# Patient Record
Sex: Male | Born: 1955 | Race: White | Hispanic: No | Marital: Single | Smoking: Never smoker
Health system: Southern US, Community
[De-identification: ages and names within clinical notes are randomized; demographics above are authoritative.]

## PROBLEM LIST (undated history)

## (undated) DIAGNOSIS — G4733 Obstructive sleep apnea (adult) (pediatric): Secondary | ICD-10-CM

## (undated) DIAGNOSIS — M7989 Other specified soft tissue disorders: Secondary | ICD-10-CM

## (undated) DIAGNOSIS — Q909 Down syndrome, unspecified: Secondary | ICD-10-CM

## (undated) DIAGNOSIS — R0989 Other specified symptoms and signs involving the circulatory and respiratory systems: Secondary | ICD-10-CM

## (undated) DIAGNOSIS — Z8639 Personal history of other endocrine, nutritional and metabolic disease: Secondary | ICD-10-CM

## (undated) DIAGNOSIS — F79 Unspecified intellectual disabilities: Secondary | ICD-10-CM

## (undated) DIAGNOSIS — I872 Venous insufficiency (chronic) (peripheral): Secondary | ICD-10-CM

## (undated) HISTORY — DX: Personal history of other endocrine, nutritional and metabolic disease: Z86.39

## (undated) HISTORY — DX: Other specified symptoms and signs involving the circulatory and respiratory systems: R09.89

## (undated) HISTORY — DX: Obstructive sleep apnea (adult) (pediatric): G47.33

## (undated) HISTORY — DX: Down syndrome, unspecified: Q90.9

## (undated) HISTORY — DX: Other specified soft tissue disorders: M79.89

## (undated) HISTORY — PX: OTHER SURGICAL HISTORY: SHX169

## (undated) HISTORY — DX: Venous insufficiency (chronic) (peripheral): I87.2

## (undated) HISTORY — DX: Unspecified intellectual disabilities: F79

---

## 2008-08-28 ENCOUNTER — Inpatient Hospital Stay (HOSPITAL_COMMUNITY): Admission: EM | Admit: 2008-08-28 | Discharge: 2008-09-03 | Payer: Self-pay | Admitting: Emergency Medicine

## 2010-03-30 ENCOUNTER — Observation Stay (HOSPITAL_COMMUNITY)
Admission: RE | Admit: 2010-03-30 | Discharge: 2010-04-02 | Payer: Self-pay | Source: Home / Self Care | Attending: Orthopedic Surgery | Admitting: Orthopedic Surgery

## 2010-04-28 NOTE — Op Note (Signed)
NAME:  Steve Byrd, Steve Byrd NO.:  [id number]  MEDICAL RECORD NO.:  [id number]          PATIENT TYPE:  INP  LOCATION:  5038                         FACILITY:  [facility]  PHYSICIAN:  Vania Rea. Autie Vasudevan, M.D.  DATE OF BIRTH:  Jun 21, 1955  DATE OF PROCEDURE:  03/30/2010 DATE OF DISCHARGE:                              OPERATIVE REPORT   PREOPERATIVE DIAGNOSIS:  Displaced right patellar fracture.  POSTOPERATIVE DIAGNOSIS:  Displaced right patellar fracture.  PROCEDURE:  Open reduction and internal fixation of displaced right patella fracture.  SURGEON:  Vania Rea. Markell Sciascia, MD  ASSISTANT:  Lucita Lora. Shuford PA-C  ANESTHESIA:  LMA general as well as a femoral nerve block.  TOURNIQUET TIME:  Less than 1 hour.  ESTIMATED BLOOD LOSS:  Minimal.  DRAINS:  None.  HISTORY:  Steve Byrd is a 55 year old gentleman with Down syndrome who fell sustaining a moderately displaced right transverse patellar fracture. He is brought to the operating room at this time for planned ORIF.  Preoperatively, we counseled Steve Byrd as well as his sister who is his healthcare power of attorney regarding treatment options as well as risks versus benefits thereof.  Possible surgical complications including the potential for bleeding, infection, neurovascular injury, malunion, nonunion, loss of fixation, potential need for additional surgery including hardware removal.  They understand and accept and agreed with our planned procedure.  PROCEDURE IN DETAIL:  After undergoing routine preop evaluation, the patient received prophylactic antibiotics.  Femoral nerve block was established by the Anesthesia Department.  Brought to the operating room, placed in supine on the operating table, underwent smooth induction of an LMA general anesthesia.  Tourniquet applied to the right thigh, right leg was sterilely prepped and draped in standard fashion. Time-out was called.  Leg was exsanguinated with the tourniquet  inflated to 300 mmHg.  An anterior midline incision was then made approximately 12 cm of length over the patella.  Skin flaps were elevated.  Dissection carried deeply and the soft tissues overlying the prepatellar region were then divided and retracted medially and laterally.  This allowed exposure of the fracture site, which was opened with combination of blunt and sharp dissection.  Interposed soft tissue at the fracture site was then irrigated free and removed.  We did visualize the trochlear groove, which was intact.  After the fracture site had been mobilized and meticulously cleaned under direct visualization, we then reduced the patella fracture and temporarily held this with a bone holding tenaculum.  Fluoroscopic imaging was then utilized to confirm good alignment of the fracture site and some adjustments of the reduction were then completed and confirmed overall good alignment.  There was some degree of comminution at the fracture site.  Once this fixation had been temporarily achieved, we then passed two 0.08-998 K-wires from proximal to distal traversing the fracture site with proper positioning confirmed fluoroscopically.  We then passed an 18-gauge stainless steel wire beneath the K-wires both proximally and distally in a figure-of- eight construct.  The tension band was then achieved utilizing a wire twisters allowing excellent compression across the fracture site.  Final fluoroscopic images were then obtained to confirm  good alignment at the fracture site.  We clipped the excess length of the stainless steel wire and also bent the tips of the K-wires and terminally impacted them for a stable construct.  Final images were obtained.  The wound was irrigated. Closed with #1 Vicryl for the deep fascial layer, 2-0 Vicryl for the subcu and intracuticular 3-0 Monocryl for the skin followed by Steri- Strips.  Dry dressing was wrapped about the knee and leg was wrapped with Ace  bandage and support stocking and knee immobilizer, maintaining full extension.  The tourniquet was then let down.  The patient was awakened, extubated, and taken to the recovery room in stable condition.     Vania Rea. Markeis Allman, M.D.     KMS/MEDQ  D:  03/30/2010  T:  03/31/2010  Job:  [id number]  Electronically Signed by Francena Hanly M.D. on 04/26/2010 07:43:50 AM

## 2010-06-12 LAB — PROTIME-INR
INR: 0.98 (ref 0.00–1.49)
Prothrombin Time: 13.2 seconds (ref 11.6–15.2)

## 2010-06-12 LAB — COMPREHENSIVE METABOLIC PANEL
ALT: 14 U/L (ref 0–53)
Albumin: 3.5 g/dL (ref 3.5–5.2)
Alkaline Phosphatase: 71 U/L (ref 39–117)
Calcium: 9.2 mg/dL (ref 8.4–10.5)
GFR calc Af Amer: >60 mL/min (ref >60)
GFR calc non Af Amer: >60 mL/min (ref >60)
Potassium: 4.5 mEq/L (ref 3.5–5.1)
Sodium: 141 mEq/L (ref 135–145)

## 2010-06-12 LAB — GLUCOSE, CAPILLARY
Glucose-Capillary: 131 mg/dL — ABNORMAL HIGH (ref 70–99)
Glucose-Capillary: 134 mg/dL — ABNORMAL HIGH (ref 70–99)
Glucose-Capillary: 141 mg/dL — ABNORMAL HIGH (ref 70–99)
Glucose-Capillary: 146 mg/dL — ABNORMAL HIGH (ref 70–99)
Glucose-Capillary: 151 mg/dL — ABNORMAL HIGH (ref 70–99)
Glucose-Capillary: 154 mg/dL — ABNORMAL HIGH (ref 70–99)

## 2010-06-12 LAB — CBC
MCH: 31.1 pg (ref 26.0–34.0)
MCHC: 32.8 g/dL (ref 30.0–36.0)
RDW: 14.8 % (ref 11.5–15.5)
WBC: 7.9 10*3/uL (ref 4.0–10.5)

## 2010-06-12 LAB — SURGICAL PCR SCREEN: MRSA, PCR: NEGATIVE

## 2010-06-12 LAB — URINALYSIS, ROUTINE W REFLEX MICROSCOPIC
Glucose, UA: NEGATIVE mg/dL
Nitrite: NEGATIVE
Protein, ur: NEGATIVE mg/dL

## 2010-07-03 NOTE — Discharge Summary (Signed)
  NAME:  Steve Byrd, Steve Byrd NO.:  [id number]  MEDICAL RECORD NO.:  [id number]          PATIENT TYPE:  OBV  LOCATION:  5038                         FACILITY:  [facility]  PHYSICIAN:  Vania Rea. Kirti Carl, M.D.  DATE OF BIRTH:  1955-12-22  DATE OF ADMISSION:  03/30/2010 DATE OF DISCHARGE:  04/04/2010                              DISCHARGE SUMMARY   ADMISSION DIAGNOSIS:  Displaced right patella fracture.  DISCHARGE DIAGNOSIS:  Displaced right patella fracture.  OPERATION:  Open reduction and internal fixation.  SURGEON:  Vania Rea. Steve Neels, MD  ASSISTANT:  Steve Lora. Shuford, PA-C  ANESTHESIA:  General anesthetic.  BRIEF HISTORY:  Steve Byrd is a 55 year old, Down syndrome gentleman who sustained a right patella fracture and required an operative intervention.  He scheduled for above-named procedure.  HOSPITAL COURSE:  The patient was admitted and underwent the above-named procedure and tolerated this well.  All appropriate IV antibiotics and analgesics were utilized.  Postoperatively, unfortunately, he had some nausea and vomiting on postop day #1 and this hindered his formal physical therapy.  He was kept an additional day or two, to work through stairs for safety precautions at home.  By the date of April 04, 2010, he was afebrile.  Vital signs were stable.  He was neurovascularly intact.  He had no longer nausea and vomiting.  At this time, he was stable for discharge to home and follow up on an outpatient basis.  LABORATORY VALUES:  On the chart, available for review.  CONDITION ON DISCHARGE:  Stable, improved.  DISCHARGE MEDICATIONS AND PLAN:  The patient is being discharged to home.  He may be weightbearing as tolerated in the knee immobilizer.  No range of motion to the knee.  May shower on day #5.  Follow up in our office in 2 weeks, call for time.  Prescriptions for Percocet was provided as well as Robaxin.  Call for any further problems.  CONDITION ON  DISCHARGE:  Stable, improved.  May resume home diet. Resume other home meds per med reconciliation.     Steve Byrd, P.A.-C.   ______________________________ Vania Rea. Ryka Beighley, M.D.    TAS/MEDQ  D:  05/17/2010  T:  05/17/2010  Job:  [id number]  Electronically Signed by Steve Byrd P.A.-C. on 06/19/2010 08:41:02 AM Electronically Signed by Steve Byrd M.D. on 07/03/2010 12:50:25 PM

## 2010-07-10 ENCOUNTER — Encounter (HOSPITAL_COMMUNITY)
Admission: RE | Admit: 2010-07-10 | Discharge: 2010-07-10 | Disposition: A | Discharge status: Home / Self Care | Payer: Medicare Other | Source: Ambulatory Visit | Attending: Orthopedic Surgery | Admitting: Orthopedic Surgery

## 2010-07-10 LAB — URINALYSIS, ROUTINE W REFLEX MICROSCOPIC
Glucose, UA: NEGATIVE mg/dL
Hgb urine dipstick: NEGATIVE
Ketones, ur: NEGATIVE mg/dL
Leukocytes, UA: NEGATIVE
Protein, ur: NEGATIVE mg/dL
Urobilinogen, UA: 0.2 mg/dL (ref 0.0–1.0)

## 2010-07-10 LAB — GLUCOSE, CAPILLARY
Glucose-Capillary: 126 mg/dL — ABNORMAL HIGH (ref 70–99)
Glucose-Capillary: 127 mg/dL — ABNORMAL HIGH (ref 70–99)
Glucose-Capillary: 136 mg/dL — ABNORMAL HIGH (ref 70–99)
Glucose-Capillary: 143 mg/dL — ABNORMAL HIGH (ref 70–99)
Glucose-Capillary: 155 mg/dL — ABNORMAL HIGH (ref 70–99)
Glucose-Capillary: 155 mg/dL — ABNORMAL HIGH (ref 70–99)
Glucose-Capillary: 162 mg/dL — ABNORMAL HIGH (ref 70–99)
Glucose-Capillary: 174 mg/dL — ABNORMAL HIGH (ref 70–99)
Glucose-Capillary: 186 mg/dL — ABNORMAL HIGH (ref 70–99)

## 2010-07-10 LAB — CBC
HCT: 28.7 % — ABNORMAL LOW (ref 39.0–52.0)
Hemoglobin: 13.1 g/dL (ref 13.0–17.0)
Hemoglobin: 9.5 g/dL — ABNORMAL LOW (ref 13.0–17.0)
MCHC: 33 g/dL (ref 30.0–36.0)
Platelets: 240 10*3/uL (ref 150–400)
RBC: 2.98 MIL/uL — ABNORMAL LOW (ref 4.22–5.81)
RDW: 15.1 % (ref 11.5–15.5)
WBC: 7.4 10*3/uL (ref 4.0–10.5)

## 2010-07-10 LAB — DIFFERENTIAL
Basophils Relative: 1 % (ref 0–1)
Eosinophils Absolute: 0.1 10*3/uL (ref 0.0–0.7)
Eosinophils Relative: 1 % (ref 0–5)
Neutro Abs: 3.7 10*3/uL (ref 1.7–7.7)
Neutrophils Relative %: 49 % (ref 43–77)

## 2010-07-10 LAB — SURGICAL PCR SCREEN
MRSA, PCR: NEGATIVE
Staphylococcus aureus: NEGATIVE

## 2010-07-10 LAB — COMPREHENSIVE METABOLIC PANEL
ALT: 11 U/L (ref 0–53)
Alkaline Phosphatase: 72 U/L (ref 39–117)
BUN: 18 mg/dL (ref 6–23)
Chloride: 104 mEq/L (ref 96–112)
Glucose, Bld: 124 mg/dL — ABNORMAL HIGH (ref 70–99)
Potassium: 4.7 mEq/L (ref 3.5–5.1)
Sodium: 140 mEq/L (ref 135–145)
Total Bilirubin: 0.4 mg/dL (ref 0.3–1.2)

## 2010-07-10 LAB — URINE MICROSCOPIC-ADD ON

## 2010-07-11 LAB — BASIC METABOLIC PANEL
BUN: 9 mg/dL (ref 6–23)
CO2: 29 mEq/L (ref 19–32)
CO2: 30 mEq/L (ref 19–32)
Calcium: 8.1 mg/dL — ABNORMAL LOW (ref 8.4–10.5)
Chloride: 104 mEq/L (ref 96–112)
Creatinine, Ser: 0.81 mg/dL (ref 0.4–1.5)
GFR calc Af Amer: >60 mL/min (ref >60)
GFR calc non Af Amer: >60 mL/min (ref >60)
Glucose, Bld: 142 mg/dL — ABNORMAL HIGH (ref 70–99)
Potassium: 4.1 mEq/L (ref 3.5–5.1)
Sodium: 138 mEq/L (ref 135–145)

## 2010-07-11 LAB — CBC
Hemoglobin: 10.4 g/dL — ABNORMAL LOW (ref 13.0–17.0)
MCHC: 33.2 g/dL (ref 30.0–36.0)
MCV: 95.7 fL (ref 78.0–100.0)
Platelets: 146 10*3/uL — ABNORMAL LOW (ref 150–400)
RBC: 2.97 MIL/uL — ABNORMAL LOW (ref 4.22–5.81)
RBC: 3.25 MIL/uL — ABNORMAL LOW (ref 4.22–5.81)
RDW: 15.5 % (ref 11.5–15.5)

## 2010-07-11 LAB — GLUCOSE, CAPILLARY
Glucose-Capillary: 123 mg/dL — ABNORMAL HIGH (ref 70–99)
Glucose-Capillary: 137 mg/dL — ABNORMAL HIGH (ref 70–99)
Glucose-Capillary: 138 mg/dL — ABNORMAL HIGH (ref 70–99)
Glucose-Capillary: 143 mg/dL — ABNORMAL HIGH (ref 70–99)
Glucose-Capillary: 160 mg/dL — ABNORMAL HIGH (ref 70–99)
Glucose-Capillary: 189 mg/dL — ABNORMAL HIGH (ref 70–99)

## 2010-07-11 LAB — URINALYSIS, ROUTINE W REFLEX MICROSCOPIC
Bilirubin Urine: NEGATIVE
Nitrite: NEGATIVE
Specific Gravity, Urine: 1.016 (ref 1.005–1.030)
Urobilinogen, UA: 0.2 mg/dL (ref 0.0–1.0)
pH: 6.5 (ref 5.0–8.0)

## 2010-07-11 LAB — PROTIME-INR
INR: 1 (ref 0.00–1.49)
Prothrombin Time: 13.2 seconds (ref 11.6–15.2)

## 2010-07-11 LAB — APTT: aPTT: 35 seconds (ref 24–37)

## 2010-07-13 ENCOUNTER — Ambulatory Visit (HOSPITAL_COMMUNITY)
Admission: RE | Admit: 2010-07-13 | Discharge: 2010-07-13 | Disposition: A | Discharge status: Home / Self Care | Payer: Medicare Other | Source: Ambulatory Visit | Attending: Orthopedic Surgery | Admitting: Orthopedic Surgery

## 2010-07-13 DIAGNOSIS — Z01818 Encounter for other preprocedural examination: Secondary | ICD-10-CM | POA: Insufficient documentation

## 2010-07-13 DIAGNOSIS — Z472 Encounter for removal of internal fixation device: Secondary | ICD-10-CM | POA: Insufficient documentation

## 2010-07-13 LAB — GLUCOSE, CAPILLARY: Glucose-Capillary: 127 mg/dL — ABNORMAL HIGH (ref 70–99)

## 2010-07-25 NOTE — Op Note (Signed)
NAME:  Steve Byrd, Steve Byrd NO.:  [id number]  MEDICAL RECORD NO.:  [id number]           PATIENT TYPE:  O  LOCATION:  SDSC                         FACILITY:  [facility]  PHYSICIAN:  Vania Rea. Thedore Pickel, M.D.  DATE OF BIRTH:  [birth year]  DATE OF PROCEDURE:  07/13/2010 DATE OF DISCHARGE:  07/13/2010                              OPERATIVE REPORT   PREOPERATIVE DIAGNOSIS:  Retained hardware, the right knee.  POSTOPERATIVE DIAGNOSIS:  Retained hardware, the right knee.  PROCEDURE:  Removal of hardware from the right knee in the form of an 18- gauge stainless steel tension-band wire as well as two 62,000 K-wires.  SURGEON:  Vania Rea. Natally Ribera, MD  ASSISTANT:  Lucita Lora. Shuford, PAC  ANESTHESIA:  General endotracheal as well as local.  ESTIMATED BLOOD LOSS:  Minimal.  DRAINS:  None.  HISTORY:  Mr. Schwandt is a 55 year old gentleman who is status post an ORIF of a displaced right patella fracture that I performed approximately 6 months ago and he has gone on to solid clinical radiographic union with painful retained hardware.  Brought to the operating room at this time for planned hardware removal.  Preoperatively counseled Ms. Dansereau and his family members on treatment options as well as risks versus benefits thereof.  Possible surgical complications were reviewed including potential for bleeding, infection, neurovascular injury, persistent pain, recurrent injury, and possible need for additional surgery.  They understand and accepted and agreed with planned procedure.  PROCEDURE IN DETAIL:  After undergoing routine preop evaluation, the patient received prophylactic antibiotics, brought to the operating room, placed in supine on the operating table, underwent smooth induction of an LMA general anesthesia.  A tourniquet was applied in the right thigh and right leg was then sterilely prepped and draped in standard fashion.  Time-out was called.  Leg was elevated and  tourniquet was inflated to 350 mmHg.  Through his previous anterior incision, we reopened approximately 6 cm and skin flaps were elevated, skin flaps were circumferentially mobilized.  Dissection was carried deeply and the tension-band wire was identified over the anterior aspect of the patella and then dissection carried proximally to the medial, lateral, and superior poles of the patella and the twist portion of a wire were then elevated.  The wire was then clipped such that it could be extracted with traction.  We then identified the K-wires which had been placed longitudinally and they were withdrawn proximally.  The wound was then irrigated.  We then repaired the longitudinal split within the substance of the deep fascia and the extensor tendon with a #1 Vicryl.  A 2-0 Vicryl was used close the subcu layer and intracuticular 3-0 Monocryl for the skin followed by Steri-Strips.  A 0.5% Marcaine was then instilled liberally along the incision.  Dry dressings were applied.  Leg was wrapped in Ace bandage and the patient was then awakened, extubated, and taken to the recovery room in stable condition.     Vania Rea. Amoura Ransier, M.D.     KMS/MEDQ  D:  07/13/2010  T:  07/13/2010  Job:  [id number]  Electronically Signed by Francena Hanly M.D. on 07/25/2010  06:24:42 PM

## 2010-08-15 NOTE — Op Note (Signed)
NAME:  Steve, Byrd NO.:  [id number]   MEDICAL RECORD NO.:  [id number]          PATIENT TYPE:  INP   LOCATION:  0101                         FACILITY:  [facility]   PHYSICIAN:  Ollen Gross, M.D.    DATE OF BIRTH:  1955/10/04   DATE OF PROCEDURE:  08/28/2008  DATE OF DISCHARGE:                               OPERATIVE REPORT   PREOPERATIVE DIAGNOSIS:  Displaced right femoral neck fracture.   POSTOPERATIVE DIAGNOSIS:  Displaced right femoral neck fracture.   PROCEDURE:  Right hip hemiarthroplasty.   SURGEON:  Ollen Gross, M.D.   ASSISTANT:  Alexzandrew L. Perkins, P.A.C.   ANESTHESIA:  General.   ESTIMATED BLOOD LOSS:  200.   DRAINS:  Hemovac times one.   COMPLICATIONS:  None.   CONDITION:  Stable to recovery room.   BRIEF CLINICAL NOTE:  Steve Byrd is a 55 year old male who fell today  playing football, landing on his right side.  He had immediate pain and  deformity right lower extremity and could not bear weight.  He was taken  to the [facility] emergency room and noted to have a right femoral neck  fracture.  Per the family request he was transferred here for our  management.   PROCEDURE IN DETAIL:  After successful administration of general  anesthetic the patient was placed in the right lateral decubitus  position with the left side up and held with the hip positioner.  Right  lower extremity was isolated from his perineum with plastic drapes and  prepped and draped in the usual sterile fashion.  Short posterolateral  incision is made with a 10 blade through subcutaneous tissue to the  level of the fascia lata which was incised in line with the skin  incision.  The sciatic nerve is palpated and protected and the short  external rotators isolated off the femur.  Capsulotomy is performed and  the fracture is identified.  Fracture hematoma is removed and removed  the femoral head.  Diameter is 49 mm.  The femur was retracted  anteriorly, placed a 49 mm  bipolar and had good suction fit with that.  I then cut the femoral neck down to the appropriate level and prepared  the femur with a starter reamer and then thoroughly irrigated the  femoral canal.  Axial reaming is performed to 13.5 mm, proximal reaming  to an 18D and the sleeve machined to a large.  An 18D large trial sleeve  is placed with an 18 x 13 stem and 36 plus 8 neck matching native  anteversion, a 28 plus zero head and a 49 bipolar is placed.  The hip  was reduced with excellent stability with full extension of external  rotation, 70 degrees flexion, 40 degrees adduction, 90 degrees internal  rotation, 90 degrees of flexion and 70 degrees of internal rotation.  By  placing the right leg on top of the left the leg lengths are equal.  The  trials were removed and then the permanent 18D large sleeve is placed  with an and 18 x 13 stem and 36 plus 8 neck matching native anteversion.  A 28 plus zero head is placed as is the 49 bipolar component.  The hip  was reduced with the same stability parameters.  The wound is then  copiously irrigated with saline solution and then the short rotators and  capsule reattached to the femur through drill holes with Ethibond  suture.  Fascia lata was closed over Hemovac drain with interrupted #1  Vicryl, subcu closed with #1 and 2-0 Vicryl and subcuticular running 4-0  Monocryl.  The incision is then cleaned and dried and Steri-Strips and a  bulky sterile dressing applied.  The drain was hooked to suction and he  is placed into an abduction pillow, awakened and transported to recovery  in stable condition.      Ollen Gross, M.D.  Electronically Signed     FA/MEDQ  D:  08/28/2008  T:  08/29/2008  Job:  [id number]

## 2010-08-15 NOTE — Discharge Summary (Signed)
NAME:  Steve Byrd, Steve Byrd NO.:  [id number]   MEDICAL RECORD NO.:  [id number]          PATIENT TYPE:  INP   LOCATION:  1312                         FACILITY:  [facility]   PHYSICIAN:  Ollen Gross, M.D.    DATE OF BIRTH:  Nov 11, 1955   DATE OF ADMISSION:  08/28/2008  DATE OF DISCHARGE:  09/03/2008                               DISCHARGE SUMMARY   ADDENDUM   ADMITTING AND DISCHARGE DIAGNOSES:  Please see discharge summary.   PROCEDURE:  Right hip hemiarthroplasty on Aug 28, 2008.   BRIEF HISTORY AND LABS:  As per previous discharge summary.   HOSPITAL COURSE:  Originally the patient was scheduled for possible  discharge on September 01, 2008 or September 02, 2008, however, all of paperwork and  passor numbers were not completed.  They were completed on the evening  of September 02, 2008.  Bed was found over at [facility].  He was  seen on rounds on September 03, 2008 by Dr. Lequita Halt.  The patient was doing  well and will be discharged out at that time.   DISCHARGE PLAN:  Discharge plan and meds as per previously dictated  discharge summary.   FOLLOW UP:  Addendum:  The patient will need to follow-up at [facility] at the [facility] office at [address] [place].  He will need to follow-up either September 21, 2008 which is a  Tuesday or September 23, 2008 which Thursday.  Please contact the office at  [phone].  He will make an appointment to transfer this patient over for  follow-up care.  Activity as per previously dictated discharge summary.  Currently he is on Lovenox injections as per meds.   DISPOSITION:  [facility].   CONDITION ON DISCHARGE:  Improved.      Alexzandrew L. Perkins, P.A.C.      Ollen Gross, M.D.  Electronically Signed    ALP/MEDQ  D:  09/03/2008  T:  09/03/2008  Job:  [id number]   cc:   [facility]

## 2010-08-15 NOTE — H&P (Signed)
NAME:  Steve Byrd, Steve Byrd NO.:  [id number]   MEDICAL RECORD NO.:  [id number]          PATIENT TYPE:  INP   LOCATION:  1312                         FACILITY:  [facility]   PHYSICIAN:  Ollen Gross, M.D.    DATE OF BIRTH:  Nov 08, 1955   DATE OF ADMISSION:  08/28/2008  DATE OF DISCHARGE:                              HISTORY & PHYSICAL   CHIEF COMPLAINT:  Right leg pain.   HISTORY OF PRESENT ILLNESS:  The patient is a 55 year old male who was  brought in to [facility] emergency room for injuries sustained to his  right hip and leg.  He is a 55 year old Down syndrome patient who lives  in a group home.  He is accompanied by his family.  He was transferred  over from [facility] where he was initially seen and found to  have a subcapital femoral neck fracture.  He was transferred to [facility] to [facility] for further care, seen by Dr. Ollen Gross  and admitted for surgery.   ALLERGIES:  No known drug allergies.   CURRENT MEDICATIONS:  1. Aricept 5 mg q.a.m.  2. Avandamet 4/100 q.a.m. with breakfast.  3. Omeprazole 20 mg daily.  4. Vytorin 10/40 every night.  5. Aspirin 325 daily.  6. He also uses ciclopirox topical, apply to toenail daily.  7. Mupirocin ointment daily.   PAST MEDICAL HISTORY:  1. Diabetes mellitus.  2. Down syndrome.  3. Sleep apnea, which he currently uses CPAP.   PAST SURGICAL HISTORY:  Negative.   FAMILY HISTORY:  With diabetes.   SOCIAL HISTORY:  A 55 year old male Down syndrome patient who lives in a  group home.   REVIEW OF SYSTEMS:  GENERAL:  No fevers, chills or night sweats.  NEUROLOGICAL:  No seizure, syncope or paralysis. RESPIRATORY:  No  shortness of breath, productive cough or hemoptysis. CARDIOVASCULAR:  No  chest pain, angina or orthopnea. GASTROINTESTINAL:  No nausea, vomiting,  diarrhea or constipation. GENITOURINARY:  No dysuria, hematuria or  discharge. MUSCULOSKELETAL:  Right hip.   PHYSICAL  EXAMINATION:  VITAL SIGNS:  Temperature 97.7, pulse 76,  respirations 20, blood pressure 108/69.  GENERAL:  A 55 year old white male slightly overweight.  He is alert,  oriented, cooperative, very pleasant.  Accompanied by his family.  HEENT:  Normocephalic, atraumatic.  Pupils round and reactive.  EOMs  intact.  NECK:  Supple.  CHEST:  Clear.  HEART:  Regular rhythm.  No murmur.  ABDOMEN:  Round, soft, slightly protuberant abdomen.  Bowel sounds  present.  RECTAL, BREAST, GENITALIA:  Not done, not pertinent to present illness.  EXTREMITIES:  Right lower extremity shortened and externally rotated as  compared to the left.  Motor function is intact.   EKG:  Normal sinus rhythm.  Normal EKG.   LABORATORY DATA:  CBC on admission showed a hemoglobin of 12.7,  hematocrit 37.3, white cell count 10.4.  Electrolytes showed sodium 142,  potassium 4.3, chloride 106, CO2 28, BUN 21, creatinine 1, glucose 117.   X-RAYS:  Chest x-ray showed cardiomegaly without evidence of acute  cardiopulmonary disease.  Right  hip films showed right subcapital  proximal femur with varus angulation.   IMPRESSION:  1. Right subcapital femoral neck fracture, angulated.  2. Diabetes mellitus.  3. Down syndrome.  4. Sleep apnea.   PLAN:  The patient admitted to [facility] to undergo a right  hip hemiarthroplasty.  Surgery is to be performed by Dr. Ollen Gross.      Steve Byrd, P.A.C.      Ollen Gross, M.D.  Electronically Signed    ALP/MEDQ  D:  09/01/2008  T:  09/01/2008  Job:  [id number]   cc:   Ollen Gross, M.D.  Fax: [phone]

## 2010-08-15 NOTE — Discharge Summary (Signed)
NAME:  Steve Byrd, Steve Byrd NO.:  [id number]   MEDICAL RECORD NO.:  [id number]          PATIENT TYPE:  INP   LOCATION:  1312                         FACILITY:  [facility]   PHYSICIAN:  Ollen Gross, M.D.    DATE OF BIRTH:  03/23/1956   DATE OF ADMISSION:  08/28/2008  DATE OF DISCHARGE:  09/01/2008                               DISCHARGE SUMMARY   ADMITTING DIAGNOSES:  1. Right subcapital femoral neck fracture.  2. Diabetes mellitus.  3. Down's syndrome.  4. Sleep apnea, currently uses continuous positive airway pressure.   DISCHARGE DIAGNOSES:  1. Displaced right femoral neck fracture, status post right hip      hemiarthroplasty.  2. Mild postoperative acute blood loss anemia that did not require      transfusion.  3. Diabetes mellitus.  4. Down's syndrome.  5. Sleep apnea, currently uses continuous positive airway pressure.   PROCEDURE:  On Aug 28, 2008, right hip hemiarthroplasty.   SURGEON:  Dr. Lequita Halt.   ASSISTANT:  Avel Peace, PA-C.   ANESTHESIA:  General.   CONSULTS:  None.   BRIEF HISTORY:  Steve Byrd, he goes by Steve Byrd is a 55 year old male who fell  today playing dodge ball landing on his right side and had immediate  pain and deformity, unable to able walk.  He was initially taken to  [facility] emergency room where x-rays found where he had  sustained a fracture.  He was transferred to [facility] for  further care.   LABORATORY DATA:  CBC on admission; hemoglobin 12.7, hematocrit 37.3,  white cell count 10.4.  PT/INR 13.2 and 1.0, PTT of 35.  UA was  negative.  BMET on admission within normal limits.  Serial CBCs were  followed.  Hemoglobin 10.4 to 9.4 back up to 9.5 with a hematocrit of  28.7.  serial BMETs were followed.  Electrolytes remained within normal  limits.   X-RAYS:  Chest x-ray report sent over showed cardiomegaly without  evidence of acute cardiopulmonary disease.  Right hip films shows right  subcapital proximal  femur fracture with varus angulation.   HOSPITAL COURSE:  Steve Byrd is a 55 year old male who was transferred over from  [facility].  He was taken in transfer by Dr. Lequita Halt, seen and  admitted for surgery.  He was taken to OR later that evening and  underwent the above-stated procedure without complication.  The patient  tolerated the procedure well.  Later, he was transferred to the recovery  room and then to the medical surgical floor for postoperative care.  He  was started on p.o. and IV analgesics for pain control.  He was actually  doing pretty well on day one with minimal pain.  Hemoglobin was at 10.4.  Electrolytes looked good.  He had a Hemovac drain placed time at the  time surgery which was pulled without difficulty.  He was placed on  Lovenox for a DVT prophylaxis and encouraged p.o. meds.  He started bed  to chair transfers.  He did get up and walk about just a few steps in  the room.  By day #  2, he was doing well.  He was afebrile.  He was  walking about 6 feet in the room with assistance therapy.  We got  discharge planning involved.  We felt that the patient would require  some type of skilled nursing facility prior to going back to a group  home where he needed to be fully independent.  Discharge planning got  involved.  He was seen on day #3.  Dressing change, incision looked  good.  Hemoglobin was at 9.5.  We discontinued the fluids and the IVs  since the patient was eating well, no complaints.  By postop day #4,  September 01, 2008, there was a possibility that a bed was available.  He was  seen in rounds by Dr. Lequita Halt and no complaints, no pain, doing well,  and it was found that the patient would transferred out once final  arrangements were made.   DISCHARGE/PLAN:  1. The patient will be transferred to a skilled nursing facility of      choice, awaiting final bed offers.  2. Discharge diagnoses, please see above.  3. Discharge medications:  Current medications at  time of transfer      include, Lovenox injections 40 mg subcu daily for 6 more days and      then discontinue, Colace 100 mg p.o. b.i.d., Robaxin 500 mg p.o.      q.6-8 h., p.r.n. spasm, Benadryl 12.5-25 p.o. nightly p.r.n. sleep,      Percocet 5 mg 1-2 every 4 hours as needed for pain, Avandamet      07/998 p.o. q.a.m., Prilosec 20 mg p.o. daily, Aricept 5 mg p.o.      q.a.m., Vytorin 10/40 p.o. nightly.  He may resume his aspirin 325      mg p.o. daily.  He also takes Ciclopirox appointment to feet and      toes daily and also mupirocin ointment 2% to sores of his knees      twice a day.   DIET:  Diabetic, medium modified carb diet.   ACTIVITY:  He can be weightbearing as tolerated to the right lower  extremity, hip precautions.  He was utilizing an abduction pillow in the  hospital, but this may be discontinued.  Recommend that he uses a  regular bed pillow between his knees.  While he is in the chair and  while he is in the bed, does not need any brace for walking.  However,  he can use a walker for mobility.  PT and OT for gait training  ambulation, ADLs.  He needs daily dressing change to the right hip  incision.  He may start showering, however, do not submerge the incision  under water.   FOLLOW UP:  He needs to follow-up in approximately 2 weeks with Dr.  Ollen Gross at the [facility] office in [address] [place]  [place]  Please contact the office of [phone] to help  make an appointment and transfer arrangements for this patient.   DISPOSITION:  Pending at this time, we are waiting on final bed offers.   CONDITION ON DISCHARGE:  Improving.      Alexzandrew L. Perkins, P.A.C.      Ollen Gross, M.D.  Electronically Signed    ALP/MEDQ  D:  09/01/2008  T:  09/01/2008  Job:  [id number]   cc:   Ollen Gross, M.D.  Fax: [phone]

## 2010-08-15 NOTE — Discharge Summary (Signed)
NAME:  Steve Byrd, Steve Byrd NO.:  [id number]   MEDICAL RECORD NO.:  [id number]          PATIENT TYPE:  INP   LOCATION:  1312                         FACILITY:  [facility]   PHYSICIAN:  Ollen Gross, M.D.    DATE OF BIRTH:  12-20-1955   DATE OF ADMISSION:  08/28/2008  DATE OF DISCHARGE:                               DISCHARGE SUMMARY   ADDENDUM:   ADMITTING AND DISCHARGE DIAGNOSES:  As per previous discharge summary.   SURGERY:  As per previous discharge summary.   HISTORY:  Martinez who goes by W.R. is a 55 year old male who underwent  right hip hemiarthroplasty.  He originally was scheduled for discharge  on September 01, 2008, but a bed was not available.  He remained in the  hospital an extra day, resting well.  Seen on September 02, 2008, no  complaints, doing well and tentative plan was for discharge today.   DISCHARGE PLAN:  As per previously dictated summary, please see  discharge summary.   CONDITION ON DISCHARGE:  Improving.   DISPOSITION:  Pending at this time, waiting on final bed offer.      Alexzandrew L. Perkins, P.A.C.      Ollen Gross, M.D.  Electronically Signed    ALP/MEDQ  D:  09/02/2008  T:  09/02/2008  Job:  [id number]   cc:   Ollen Gross, M.D.  Fax: [phone]

## 2010-11-09 DIAGNOSIS — M7989 Other specified soft tissue disorders: Secondary | ICD-10-CM

## 2010-11-09 DIAGNOSIS — R0989 Other specified symptoms and signs involving the circulatory and respiratory systems: Secondary | ICD-10-CM

## 2010-11-09 DIAGNOSIS — E119 Type 2 diabetes mellitus without complications: Secondary | ICD-10-CM | POA: Insufficient documentation

## 2011-08-26 IMAGING — CR DG CHEST 2V
2 series · 2 of 2 positions shown · non-contrast
Comparison: None

CLINICAL DATA: Preop respiratory exam.  Patella surgery.

CHEST - 2 VIEW

[view not recorded (1 of 2)]
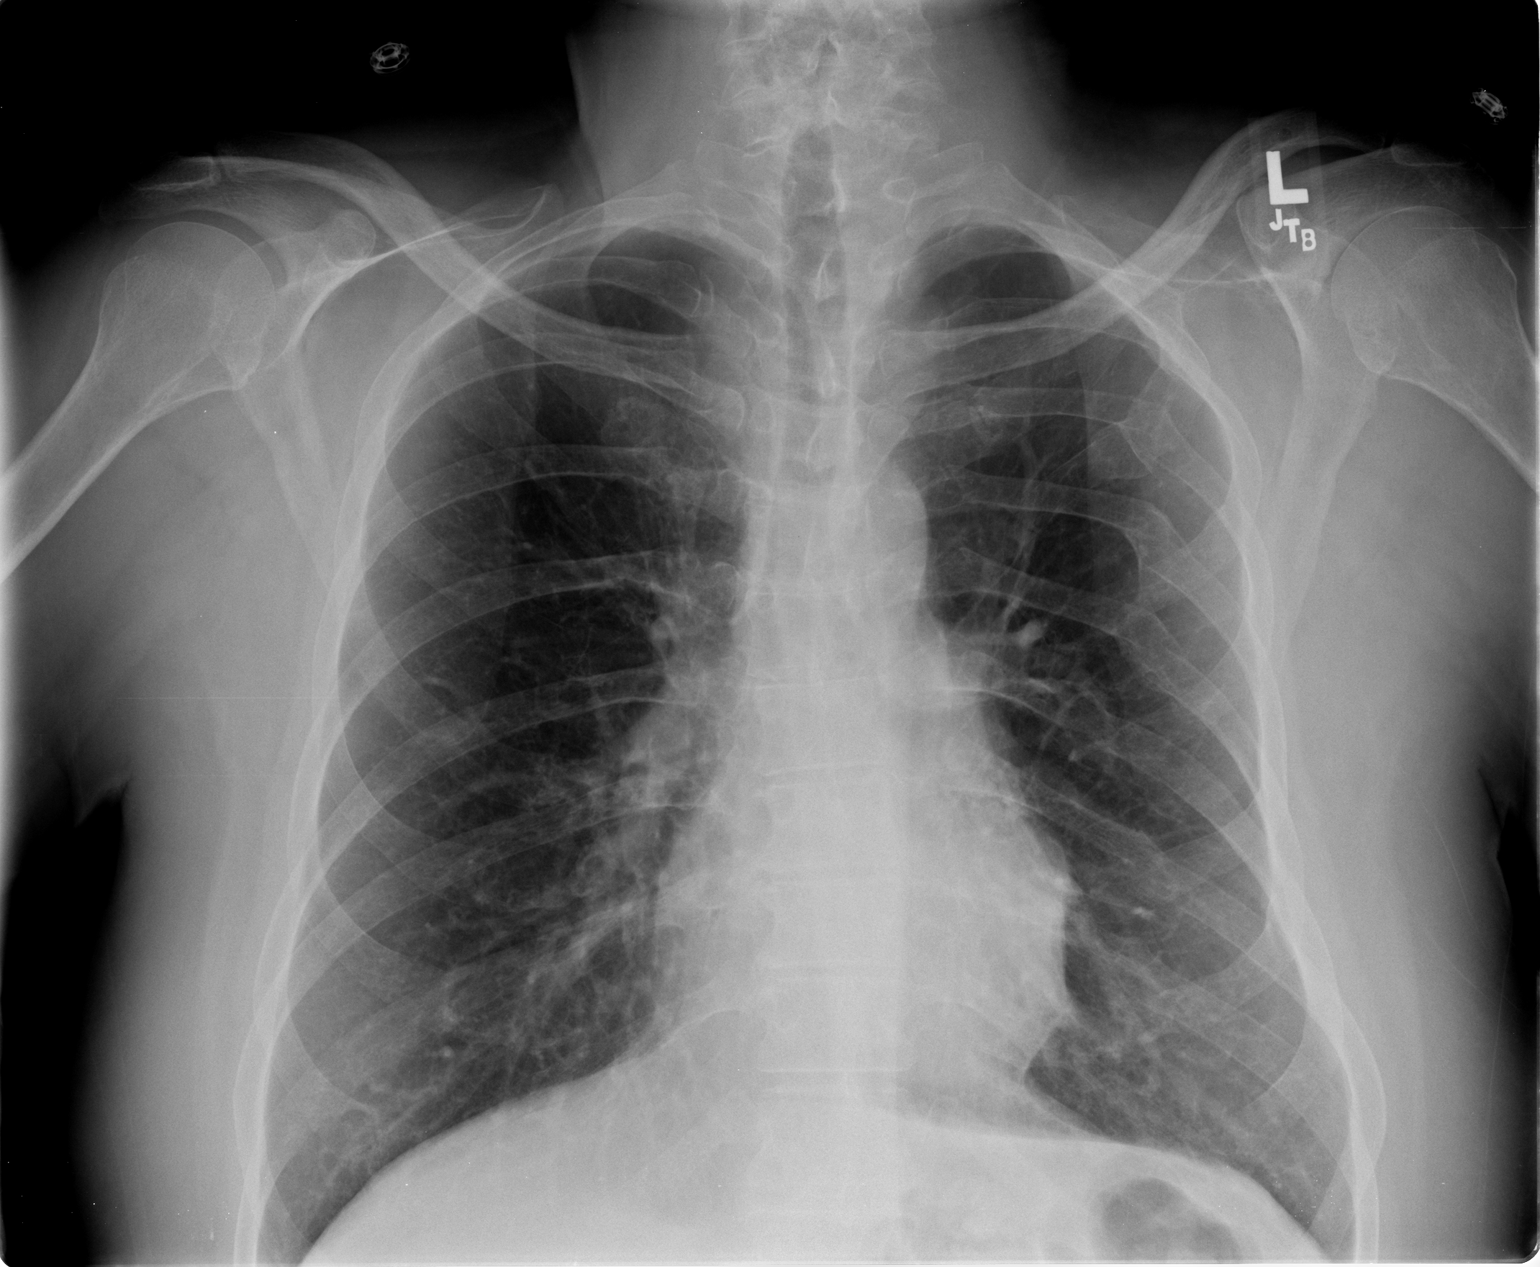

[view not recorded (2 of 2)]
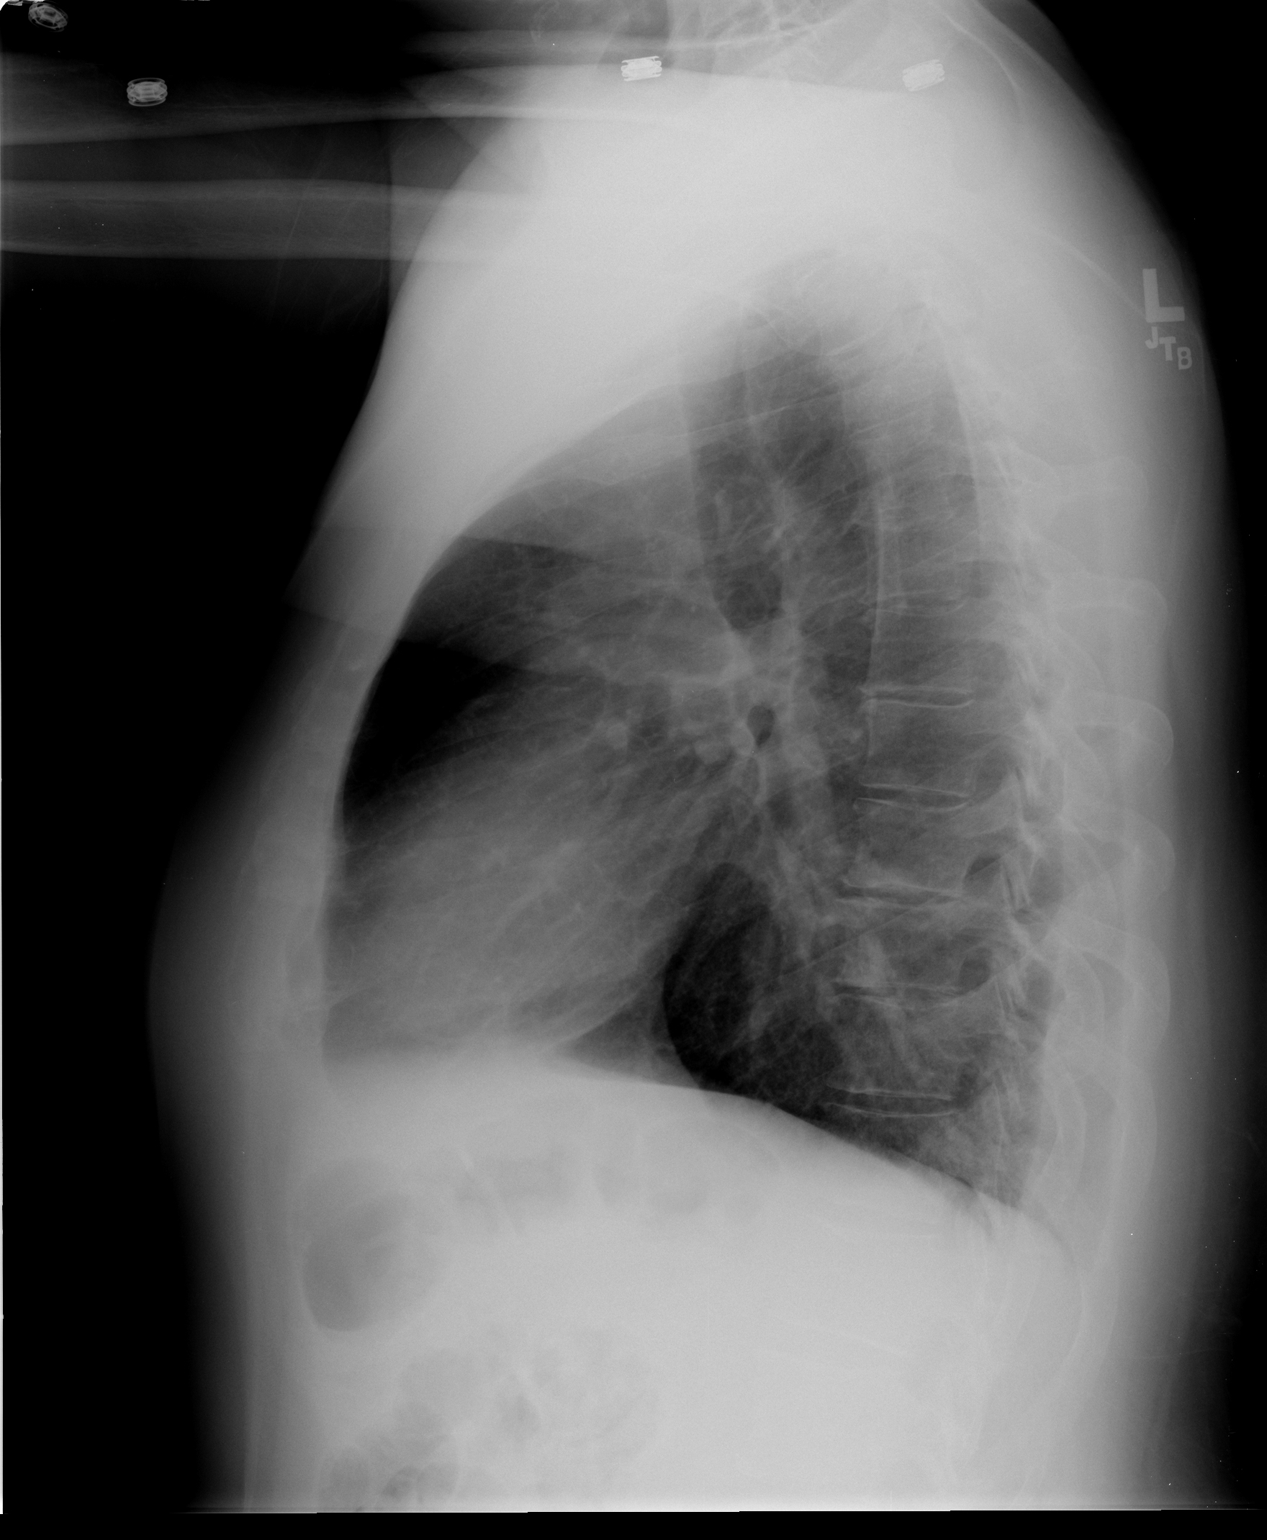

[2 of 2 positions shown; findings below may reference images not displayed]

FINDINGS: Heart size is normal.  Negative for heart failure.  The
lungs are clear without infiltrate or effusion.  Multiple chronic
healed rib fractures on the left.
IMPRESSION: No acute cardiopulmonary disease.

## 2012-04-02 HISTORY — PX: NECK SURGERY: SHX720

## 2013-01-22 ENCOUNTER — Ambulatory Visit (INDEPENDENT_AMBULATORY_CARE_PROVIDER_SITE_OTHER): Payer: Medicare Other

## 2013-01-22 VITALS — BP 133/73 | HR 46

## 2013-01-22 DIAGNOSIS — M2010 Hallux valgus (acquired), unspecified foot: Secondary | ICD-10-CM

## 2013-01-22 DIAGNOSIS — E1149 Type 2 diabetes mellitus with other diabetic neurological complication: Secondary | ICD-10-CM

## 2013-01-22 DIAGNOSIS — L608 Other nail disorders: Secondary | ICD-10-CM

## 2013-01-22 DIAGNOSIS — E1140 Type 2 diabetes mellitus with diabetic neuropathy, unspecified: Secondary | ICD-10-CM

## 2013-01-22 DIAGNOSIS — M2040 Other hammer toe(s) (acquired), unspecified foot: Secondary | ICD-10-CM

## 2013-01-22 NOTE — Patient Instructions (Signed)

## 2013-01-22 NOTE — Progress Notes (Signed)
  Subjective:    Patient ID: Steve Byrd, male    DOB: 1955-11-12, 57 y.o.   MRN: [id number]  HPI trim my nails Patient presents this time for followup diabetic foot and nail care. No changes in medication her health history since last seen.   Review of Systems  Constitutional: Negative.   HENT: Negative.   Respiratory: Negative.   Cardiovascular: Negative.   Endocrine: Negative.   Neurological: Positive for numbness.       Objective:   Physical Exam  Vitals reviewed. Constitutional: He appears well-developed and well-nourished.  Cardiovascular:  Pulses:      Dorsalis pedis pulses are 2+ on the right side, and 2+ on the left side.       Posterior tibial pulses are 0 on the right side, and 0 on the left side.  Capillary refill time 4 seconds all digits skin temperature warm to cool turgor normal no edema. There is signs of venous stasis and varicosities both lower extremities legs and ankles  Musculoskeletal: Normal range of motion.  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 Semirigid digital contractures 2 through 5 bilateral. History of keratoses no open wounds or ulcerations noted current time.  Neurological: He is alert. He has normal strength.  Epicritic sensation diminished on Semmes Weinstein testing to the forefoot digits and plantar forefoot. DTRs not elicited. Normal plantar response bilateral to  Skin: Skin is warm and dry. No cyanosis. Nails show no clubbing.  Skin color and pigment are altered due to hyperpigmentation and venous stasis changes both lower extremity. Skin is thinned with decreased turgor. Nails thick brittle friable discolored and darkened and incurvated 1 through 5 bilateral.          Assessment & Plan:  Diabetes with history peripheral neuropathy affecting both lower extremities. Patient does have some muscle strength loss and walks with the assistance of a walker. Nails thick brittle dystrophic friable a debridement  presence of diabetes and complicate factors x10. Patient does have dry friable skin recommend lotion to skin daily/diabetes foot care instructions dispensed for patient use. Followup in 3 months for continued nail N. diabetic foot care  Alvan Dame DPM

## 2013-03-31 ENCOUNTER — Other Ambulatory Visit: Payer: Self-pay

## 2013-04-23 ENCOUNTER — Ambulatory Visit: Payer: Medicare Other

## 2013-05-21 ENCOUNTER — Ambulatory Visit: Payer: Medicare Other

## 2013-06-11 ENCOUNTER — Ambulatory Visit (INDEPENDENT_AMBULATORY_CARE_PROVIDER_SITE_OTHER): Payer: Medicare Other

## 2013-06-11 VITALS — BP 131/74 | HR 56 | Resp 18

## 2013-06-11 DIAGNOSIS — L608 Other nail disorders: Secondary | ICD-10-CM

## 2013-06-11 DIAGNOSIS — R0989 Other specified symptoms and signs involving the circulatory and respiratory systems: Secondary | ICD-10-CM

## 2013-06-11 DIAGNOSIS — E1149 Type 2 diabetes mellitus with other diabetic neurological complication: Secondary | ICD-10-CM

## 2013-06-11 DIAGNOSIS — E1140 Type 2 diabetes mellitus with diabetic neuropathy, unspecified: Secondary | ICD-10-CM

## 2013-06-11 NOTE — Progress Notes (Signed)
   Subjective:    Patient ID: Michele RockersWilliam Fleet, male    DOB: [birth year], 58 y.o.   MRN: [id number]  HPI I am here to get my toenails trimmed    Review of Systems no new changes or findings at this time     Objective:   Physical Exam Okay objective findings as follows pedal pulses palpable DP +2/4 bilateral PT nonpalpable bilateral temperature warm to cool turgor diminished dry scaling fissure skin noted bilateral recommended lotions. There is some signs of venous stasis in her pigmentation both lower extremities the legs and ankle areas. Neurologically epicritic and proprioceptive sensations reveal absent sensation to the digits plantar forefoot arch DTRs not elicited there is normal plantar response bilateral. Biomechanically there is notable HAV deformity bilateral digital contractures the is 2 through 5 bilateral with keratoses in the past no current open wounds or ulcerations are noted dermatologic again pigment and hair color normal some other pigmentation the ankle there is thick and brittle crumbly friable gratified thick nails with discoloration and friability 1 through 5 bilateral.       Assessment & Plan:  Assessment this time his diabetes with peripheral neuropathy and angiopathy vascular compromise as well this time thick painful mycotic dystrophic brittle nails 1 through 5 bilateral debrided and the presence of diabetes and complications return for future palliative care is needed also suggested lotion to the feet on a daily basis reappointed 3 months for continued palliative care next progress Alvan Dameichard Donaven Criswell DPM

## 2013-06-11 NOTE — Patient Instructions (Signed)
Diabetes and Foot Care Diabetes may cause you to have problems because of poor blood supply (circulation) to your feet and legs. This may cause the skin on your feet to become thinner, break easier, and heal more slowly. Your skin may become dry, and the skin may peel and crack. You may also have nerve damage in your legs and feet causing decreased feeling in them. You may not notice minor injuries to your feet that could lead to infections or more serious problems. Taking care of your feet is one of the most important things you can do for yourself.  HOME CARE INSTRUCTIONS  Wear shoes at all times, even in the house. Do not go barefoot. Bare feet are easily injured.  Check your feet daily for blisters, cuts, and redness. If you cannot see the bottom of your feet, use a mirror or ask someone for help.  Wash your feet with warm water (do not use hot water) and mild soap. Then pat your feet and the areas between your toes until they are completely dry. Do not soak your feet as this can dry your skin.  Apply a moisturizing lotion or petroleum jelly (that does not contain alcohol and is unscented) to the skin on your feet and to dry, brittle toenails. Do not apply lotion between your toes.  Trim your toenails straight across. Do not dig under them or around the cuticle. File the edges of your nails with an emery board or nail file.  Do not cut corns or calluses or try to remove them with medicine.  Wear clean socks or stockings every day. Make sure they are not too tight. Do not wear knee-high stockings since they may decrease blood flow to your legs.  Wear shoes that fit properly and have enough cushioning. To break in new shoes, wear them for just a few hours a day. This prevents you from injuring your feet. Always look in your shoes before you put them on to be sure there are no objects inside.  Do not cross your legs. This may decrease the blood flow to your feet.  If you find a minor scrape,  cut, or break in the skin on your feet, keep it and the skin around it clean and dry. These areas may be cleansed with mild soap and water. Do not cleanse the area with peroxide, alcohol, or iodine.  When you remove an adhesive bandage, be sure not to damage the skin around it.  If you have a wound, look at it several times a day to make sure it is healing.  Do not use heating pads or hot water bottles. They may burn your skin. If you have lost feeling in your feet or legs, you may not know it is happening until it is too late.  Make sure your health care provider performs a complete foot exam at least annually or more often if you have foot problems. Report any cuts, sores, or bruises to your health care provider immediately. SEEK MEDICAL CARE IF:   You have an injury that is not healing.  You have cuts or breaks in the skin.  You have an ingrown nail.  You notice redness on your legs or feet.  You feel burning or tingling in your legs or feet.  You have pain or cramps in your legs and feet.  Your legs or feet are numb.  Your feet always feel cold. SEEK IMMEDIATE MEDICAL CARE IF:   There is increasing redness,   swelling, or pain in or around a wound.  There is a red line that goes up your leg.  Pus is coming from a wound.  You develop a fever or as directed by your health care provider.  You notice a bad smell coming from an ulcer or wound. Document Released: 03/16/2000 Document Revised: 11/19/2012 Document Reviewed: 08/26/2012 [facility] Patient Information 2014 [place], [place].  Scar recommendation to apply lotion to the affected feet twice daily to help with the dry scaled and fissured skin and the hand her skin lotion is acceptable

## 2013-09-10 ENCOUNTER — Ambulatory Visit (INDEPENDENT_AMBULATORY_CARE_PROVIDER_SITE_OTHER): Payer: Medicare Other

## 2013-09-10 VITALS — BP 135/74 | HR 58 | Resp 18

## 2013-09-10 DIAGNOSIS — E1149 Type 2 diabetes mellitus with other diabetic neurological complication: Secondary | ICD-10-CM

## 2013-09-10 DIAGNOSIS — L608 Other nail disorders: Secondary | ICD-10-CM

## 2013-09-10 DIAGNOSIS — E1140 Type 2 diabetes mellitus with diabetic neuropathy, unspecified: Secondary | ICD-10-CM

## 2013-09-10 NOTE — Patient Instructions (Signed)
Diabetes and Foot Care Diabetes may cause you to have problems because of poor blood supply (circulation) to your feet and legs. This may cause the skin on your feet to become thinner, break easier, and heal more slowly. Your skin may become dry, and the skin may peel and crack. You may also have nerve damage in your legs and feet causing decreased feeling in them. You may not notice minor injuries to your feet that could lead to infections or more serious problems. Taking care of your feet is one of the most important things you can do for yourself.  HOME CARE INSTRUCTIONS  Wear shoes at all times, even in the house. Do not go barefoot. Bare feet are easily injured.  Check your feet daily for blisters, cuts, and redness. If you cannot see the bottom of your feet, use a mirror or ask someone for help.  Wash your feet with warm water (do not use hot water) and mild soap. Then pat your feet and the areas between your toes until they are completely dry. Do not soak your feet as this can dry your skin.  Apply a moisturizing lotion or petroleum jelly (that does not contain alcohol and is unscented) to the skin on your feet and to dry, brittle toenails. Do not apply lotion between your toes.  Trim your toenails straight across. Do not dig under them or around the cuticle. File the edges of your nails with an emery board or nail file.  Do not cut corns or calluses or try to remove them with medicine.  Wear clean socks or stockings every day. Make sure they are not too tight. Do not wear knee-high stockings since they may decrease blood flow to your legs.  Wear shoes that fit properly and have enough cushioning. To break in new shoes, wear them for just a few hours a day. This prevents you from injuring your feet. Always look in your shoes before you put them on to be sure there are no objects inside.  Do not cross your legs. This may decrease the blood flow to your feet.  If you find a minor scrape,  cut, or break in the skin on your feet, keep it and the skin around it clean and dry. These areas may be cleansed with mild soap and water. Do not cleanse the area with peroxide, alcohol, or iodine.  When you remove an adhesive bandage, be sure not to damage the skin around it.  If you have a wound, look at it several times a day to make sure it is healing.  Do not use heating pads or hot water bottles. They may burn your skin. If you have lost feeling in your feet or legs, you may not know it is happening until it is too late.  Make sure your health care provider performs a complete foot exam at least annually or more often if you have foot problems. Report any cuts, sores, or bruises to your health care provider immediately. SEEK MEDICAL CARE IF:   You have an injury that is not healing.  You have cuts or breaks in the skin.  You have an ingrown nail.  You notice redness on your legs or feet.  You feel burning or tingling in your legs or feet.  You have pain or cramps in your legs and feet.  Your legs or feet are numb.  Your feet always feel cold. SEEK IMMEDIATE MEDICAL CARE IF:   There is increasing redness,   swelling, or pain in or around a wound.  There is a red line that goes up your leg.  Pus is coming from a wound.  You develop a fever or as directed by your health care provider.  You notice a bad smell coming from an ulcer or wound. Document Released: 03/16/2000 Document Revised: 11/19/2012 Document Reviewed: 08/26/2012 [employer] Patient Information 2014 [employer].  

## 2013-09-10 NOTE — Progress Notes (Signed)
   Subjective:    Patient ID: Steve Byrd, male    DOB: [birth year], 58 y.o.   MRN: [id number]  HPI  I AM HERE TO GET THESE TOENAILS TRIMMED AND IT HAS BEEN 3 MONTHS    Review of Systems no new findings or systemic changes noted     Objective:   Physical Exam Neurovascular status is intact and unchanged DP +2/4 bilateral PT thready nonpalpable some hyperpigmentation of both legs with history is of posse some cellulitis or venous stasis type difficulties. No active infection is no discharge drainage open wounds or ulcerations noted nails thick brittle crumbly friable discolored 1 through 5 bilateral painful and tender both and debridement palpation there is normal plantar response and DTRs neurologically decreased sensation noted that on Semmes Weinstein testing to forefoot digits is slight hammertoe deformities and digital contractures as mentioned previously again neuropathy with decreased sensations motor function is otherwise normal       Assessment & Plan:  Assessment this time his diabetes with history peripheral neuropathy and angiopathy vascular compromise pedal pulses are diminished PT pulse and some venous stasis noted nails debrided 1 through 5 bilateral the presence of diabetes and complications return for future palliative diabetic foot and nail care in 3 months for an as-needed basis for followup  Alvan Dame DPM

## 2013-11-06 ENCOUNTER — Telehealth: Payer: Self-pay | Admitting: *Deleted

## 2013-11-06 NOTE — Telephone Encounter (Signed)
We faxed over prescription saying the Dermasil is not available.  The group home is asking if it can be changed to something else.  Call me back.  Switch to a different lotion for his feet.

## 2013-11-06 NOTE — Telephone Encounter (Signed)
Called the pharmacy and stated that Dr Ralene CorkSikora said that the patient can use any type of cream over the counter. lisa

## 2013-12-10 ENCOUNTER — Ambulatory Visit: Payer: Medicare Other

## 2013-12-24 ENCOUNTER — Ambulatory Visit (INDEPENDENT_AMBULATORY_CARE_PROVIDER_SITE_OTHER): Payer: Medicare Other

## 2013-12-24 VITALS — BP 135/86 | HR 54 | Resp 18

## 2013-12-24 DIAGNOSIS — L608 Other nail disorders: Secondary | ICD-10-CM

## 2013-12-24 DIAGNOSIS — E1149 Type 2 diabetes mellitus with other diabetic neurological complication: Secondary | ICD-10-CM

## 2013-12-24 DIAGNOSIS — R0989 Other specified symptoms and signs involving the circulatory and respiratory systems: Secondary | ICD-10-CM

## 2013-12-24 DIAGNOSIS — E1140 Type 2 diabetes mellitus with diabetic neuropathy, unspecified: Secondary | ICD-10-CM

## 2013-12-24 MED ORDER — CICLOPIROX 8 % EX SOLN: CUTANEOUS | Status: DC

## 2013-12-24 NOTE — Patient Instructions (Addendum)
Diabetes and Foot Care Diabetes may cause you to have problems because of poor blood supply (circulation) to your feet and legs. This may cause the skin on your feet to become thinner, break easier, and heal more slowly. Your skin may become dry, and the skin may peel and crack. You may also have nerve damage in your legs and feet causing decreased feeling in them. You may not notice minor injuries to your feet that could lead to infections or more serious problems. Taking care of your feet is one of the most important things you can do for yourself.  HOME CARE INSTRUCTIONS  Wear shoes at all times, even in the house. Do not go barefoot. Bare feet are easily injured.  Check your feet daily for blisters, cuts, and redness. If you cannot see the bottom of your feet, use a mirror or ask someone for help.  Wash your feet with warm water (do not use hot water) and mild soap. Then pat your feet and the areas between your toes until they are completely dry. Do not soak your feet as this can dry your skin.  Apply a moisturizing lotion or petroleum jelly (that does not contain alcohol and is unscented) to the skin on your feet and to dry, brittle toenails. Do not apply lotion between your toes.  Trim your toenails straight across. Do not dig under them or around the cuticle. File the edges of your nails with an emery board or nail file.  Do not cut corns or calluses or try to remove them with medicine.  Wear clean socks or stockings every day. Make sure they are not too tight. Do not wear knee-high stockings since they may decrease blood flow to your legs.  Wear shoes that fit properly and have enough cushioning. To break in new shoes, wear them for just a few hours a day. This prevents you from injuring your feet. Always look in your shoes before you put them on to be sure there are no objects inside.  Do not cross your legs. This may decrease the blood flow to your feet.  If you find a minor scrape,  cut, or break in the skin on your feet, keep it and the skin around it clean and dry. These areas may be cleansed with mild soap and water. Do not cleanse the area with peroxide, alcohol, or iodine.  When you remove an adhesive bandage, be sure not to damage the skin around it.  If you have a wound, look at it several times a day to make sure it is healing.  Do not use heating pads or hot water bottles. They may burn your skin. If you have lost feeling in your feet or legs, you may not know it is happening until it is too late.  Make sure your health care provider performs a complete foot exam at least annually or more often if you have foot problems. Report any cuts, sores, or bruises to your health care provider immediately. SEEK MEDICAL CARE IF:   You have an injury that is not healing.  You have cuts or breaks in the skin.  You have an ingrown nail.  You notice redness on your legs or feet.  You feel burning or tingling in your legs or feet.  You have pain or cramps in your legs and feet.  Your legs or feet are numb.  Your feet always feel cold. SEEK IMMEDIATE MEDICAL CARE IF:   There is increasing redness,   swelling, or pain in or around a wound.  There is a red line that goes up your leg.  Pus is coming from a wound.  You develop a fever or as directed by your health care provider.  You notice a bad smell coming from an ulcer or wound. Document Released: 03/16/2000 Document Revised: 11/19/2012 Document Reviewed: 08/26/2012 [facility] Patient Information 2015 [place], [place]. This information is not intended to replace advice given to you by your health care provider. Make sure you discuss any questions you have with your health care provider.  Orders for application of nail antifungal cycloprox 8%. Apply the nail polish like medication to each affected toenail once daily as instructed, once every 7 days remove the buildup medication and nail polish utilizing alcohol or nail  polish remover, and resume applying the medication once daily for another 7 days maintain treatment for 6-12 months.  Alvan Dame DPM

## 2013-12-24 NOTE — Progress Notes (Signed)
   Subjective:    Patient ID: Steve Byrd, male    DOB: 1955-11-30, 58 y.o.   MRN: [id number]  HPI I AM HERE TO HAVE MY TOENAILS TRIMMED UP AND I NEED A PRESCRIPTION FOR THE PENLAC AND TO HAVE AN ORDER FOR IT TO SAY APPLY IT AND TO REMOVE IT WHEN NEEDED AND TO DC THE DERMISIL LOTION    Review of Systems no new findings or systemic changes noted     Objective:   Physical Exam Neurovascular status is intact and unchanged pedal pulses palpable DP +2 PT plus one over 4 bilateral mild varicosities history of cellulitis and stasis discolorations of both lower legs. Nails thick brittle crumbly dystrophic friable and discolored with black sock lint accumulated U. the Penlac accumulation of nail beds. No open wounds or ulcers no secondary infections mild flexible digital contractures are identified in decreased epicritic sensation Semmes Weinstein to forefoot digits patient history diabetes with complications of neuropathy and posse mild angiopathy.       Assessment & Plan:  Assessment diabetes with history peripheral neuropathy mycotic dystrophic brittle nails 1 through 5 bilateral debrided this time continue with topical Penlac application new prescription is issued also we'll discontinue dermis oh cream apply any hand cream or lotion to legs on a daily basis as recommended maintain Penlac daily remove once every 7 days with alcohol or nail polish remover recheck in 3 months for followup and continued palliative care nails debrided x10 days  Alvan Dame DPM

## 2014-03-16 ENCOUNTER — Ambulatory Visit (INDEPENDENT_AMBULATORY_CARE_PROVIDER_SITE_OTHER): Payer: Medicare Other | Admitting: Pulmonary Disease

## 2014-03-16 ENCOUNTER — Encounter: Payer: Self-pay | Admitting: Pulmonary Disease

## 2014-03-16 VITALS — BP 122/74 | HR 52 | Temp 97.8°F | Ht 69.0 in | Wt 168.8 lb

## 2014-03-16 DIAGNOSIS — G4733 Obstructive sleep apnea (adult) (pediatric): Secondary | ICD-10-CM | POA: Insufficient documentation

## 2014-03-16 NOTE — Assessment & Plan Note (Signed)
The patient has a history of obstructive sleep apnea dating back approximately 10-12 years according to the sister. He has an older machine, even though it is new to them, and the download shows breakthrough apnea but also severe mask leak. At this point, I think we need to focus on a better fitting mask, and then repeat the download to see if his apnea is controlled. His most recent compliance data looks excellent, with a 93% usage greater than 4 hours during September and October. Because of his ongoing mask fitting issues, I would like to refer him to the sleep Center for a formal fitting during the day.

## 2014-03-16 NOTE — Patient Instructions (Addendum)
Will arrange for a mask fitting session at the sleep center.  Once this is done, will get a 2 week download to see if your leak has resolved and if your sleep apnea is better controlled.  If not, will need to make adjustments to your pressure. Keep up with mask cushion changes and supplies Please call us once you get your new mask if you do not hear from your home care company about doing another download in 2 weeks.

## 2014-03-16 NOTE — Progress Notes (Signed)
Subjective:    Patient ID: Steve RockersWilliam Cowie, male    DOB: December 03, 1955, 58 y.o.   MRN: [id number]  HPI The patient is a 58 year old male who I've been asked to see for management of obstructive sleep apnea. His family member tells me that he was diagnosed with sleep apnea about 10-12 years ago, and has been on C Pap since that time. She feels that he has done very well with the device, and he has been satisfied with his response. The sister tells me that he lives at a group home, and has had difficulties over the last 4 months with his C Pap compliance, mask fit, and also breakthrough apneas. The patient tells me that his mask leaks excessively, and has not slept as well since this has been happening. He was feeling rested in the mornings and had very little daytime sleepiness except after lunch. The patient has lost about 30 pounds, and has a very small facial structure which I can see is difficult to fit. The patient states that his Epworth score today is 11. Of note, the patient has a download from October that shows excellent compliance, a fixed pressure of 12 cm, very excessive mask leak, and an AHI of 16 events per hour.   Sleep Questionnaire What time do you typically go to bed?( Between what hours) 10-10:30 10-10:30 at 1439 on 03/16/14 by Tommie SamsMindy S Silva, CMA How long does it take you to fall asleep? 15-20 min 15-20 min at 1439 on 03/16/14 by Tommie SamsMindy S Silva, CMA How many times during the night do you wake up? 1 1 at 1439 on 03/16/14 by Tommie SamsMindy S Silva, CMA What time do you get out of bed to start your day? 0700 0700 at 1439 on 03/16/14 by Tommie SamsMindy S Silva, CMA Do you drive or operate heavy machinery in your occupation? No No at 1439 on 03/16/14 by Tommie SamsMindy S Silva, CMA How much has your weight changed (up or down) over the past two years? (In pounds) 30 lb (13.608 kg) 30 lb (13.608 kg) at 1439 on 03/16/14 by Tommie SamsMindy S Silva, CMA Have you ever had a sleep study before? Yes Yes at 1439 on  03/16/14 by Tommie SamsMindy S Silva, CMA If yes, [place] [facility] at 1439 on 03/16/14 by Tommie SamsMindy S Silva, CMA If yes, date of study? Do you currently use CPAP? Yes Yes at 1439 on 03/16/14 by Tommie SamsMindy S Silva, CMA If so, what pressure? not sure not sure at 1439 on 03/16/14 by Tommie SamsMindy S Silva, CMA Do you wear oxygen at any time? No No at 1439 on 03/16/14 by Tommie SamsMindy S Silva, CMA   Review of Systems  Constitutional: Negative for fever and unexpected weight change.  HENT: Negative for congestion, dental problem, ear pain, nosebleeds, postnasal drip, rhinorrhea, sinus pressure, sneezing, sore throat and trouble swallowing.   Eyes: Negative for redness and itching.  Respiratory: Negative for cough, chest tightness, shortness of breath and wheezing.   Cardiovascular: Negative for palpitations and leg swelling.  Gastrointestinal: Negative for nausea and vomiting.  Genitourinary: Negative for dysuria.  Musculoskeletal: Negative for joint swelling.  Skin: Negative for rash.  Neurological: Negative for headaches.  Hematological: Does not bruise/bleed easily.  Psychiatric/Behavioral: Negative for dysphoric mood. The patient is not nervous/anxious.        Objective:   Physical Exam Constitutional:  Well developed, no acute distress  HENT:  Nares patent without discharge, but narrowed bilat.  Oropharynx without exudate, palate and uvula are normal, +  jaw abnormality  Eyes:  Perrla, eomi, no scleral icterus  Neck:  No JVD, no TMG  Cardiovascular:  Normal rate, regular rhythm, no rubs or gallops.  3/6 sem        Intact distal pulses  Pulmonary :  Normal breath sounds, no stridor or respiratory distress   No rales, rhonchi, or wheezing  Abdominal:  Soft, nondistended, bowel sounds present.  No tenderness noted.   Musculoskeletal:  No lower extremity edema noted.  Lymph Nodes:  No cervical lymphadenopathy noted  Skin:  No cyanosis noted  Neurologic:  Alert, appropriate, moves  all 4 extremities without obvious deficit.         Assessment & Plan:

## 2014-03-29 ENCOUNTER — Ambulatory Visit (INDEPENDENT_AMBULATORY_CARE_PROVIDER_SITE_OTHER): Payer: Medicare Other

## 2014-03-29 VITALS — BP 135/75 | HR 60 | Resp 12

## 2014-03-29 DIAGNOSIS — M2040 Other hammer toe(s) (acquired), unspecified foot: Secondary | ICD-10-CM

## 2014-03-29 DIAGNOSIS — E1140 Type 2 diabetes mellitus with diabetic neuropathy, unspecified: Secondary | ICD-10-CM

## 2014-03-29 DIAGNOSIS — L603 Nail dystrophy: Secondary | ICD-10-CM

## 2014-03-29 NOTE — Patient Instructions (Signed)
Diabetes and Foot Care Diabetes may cause you to have problems because of poor blood supply (circulation) to your feet and legs. This may cause the skin on your feet to become thinner, break easier, and heal more slowly. Your skin may become dry, and the skin may peel and crack. You may also have nerve damage in your legs and feet causing decreased feeling in them. You may not notice minor injuries to your feet that could lead to infections or more serious problems. Taking care of your feet is one of the most important things you can do for yourself.  HOME CARE INSTRUCTIONS  Wear shoes at all times, even in the house. Do not go barefoot. Bare feet are easily injured.  Check your feet daily for blisters, cuts, and redness. If you cannot see the bottom of your feet, use a mirror or ask someone for help.  Wash your feet with warm water (do not use hot water) and mild soap. Then pat your feet and the areas between your toes until they are completely dry. Do not soak your feet as this can dry your skin.  Apply a moisturizing lotion or petroleum jelly (that does not contain alcohol and is unscented) to the skin on your feet and to dry, brittle toenails. Do not apply lotion between your toes.  Trim your toenails straight across. Do not dig under them or around the cuticle. File the edges of your nails with an emery board or nail file.  Do not cut corns or calluses or try to remove them with medicine.  Wear clean socks or stockings every day. Make sure they are not too tight. Do not wear knee-high stockings since they may decrease blood flow to your legs.  Wear shoes that fit properly and have enough cushioning. To break in new shoes, wear them for just a few hours a day. This prevents you from injuring your feet. Always look in your shoes before you put them on to be sure there are no objects inside.  Do not cross your legs. This may decrease the blood flow to your feet.  If you find a minor scrape,  cut, or break in the skin on your feet, keep it and the skin around it clean and dry. These areas may be cleansed with mild soap and water. Do not cleanse the area with peroxide, alcohol, or iodine.  When you remove an adhesive bandage, be sure not to damage the skin around it.  If you have a wound, look at it several times a day to make sure it is healing.  Do not use heating pads or hot water bottles. They may burn your skin. If you have lost feeling in your feet or legs, you may not know it is happening until it is too late.  Make sure your health care provider performs a complete foot exam at least annually or more often if you have foot problems. Report any cuts, sores, or bruises to your health care provider immediately. SEEK MEDICAL CARE IF:   You have an injury that is not healing.  You have cuts or breaks in the skin.  You have an ingrown nail.  You notice redness on your legs or feet.  You feel burning or tingling in your legs or feet.  You have pain or cramps in your legs and feet.  Your legs or feet are numb.  Your feet always feel cold. SEEK IMMEDIATE MEDICAL CARE IF:   There is increasing redness,   swelling, or pain in or around a wound.  There is a red line that goes up your leg.  Pus is coming from a wound.  You develop a fever or as directed by your health care provider.  You notice a bad smell coming from an ulcer or wound. Document Released: 03/16/2000 Document Revised: 11/19/2012 Document Reviewed: 08/26/2012 [employer] Patient Information 2015 [employer]. This information is not intended to replace advice given to you by your health care provider. Make sure you discuss any questions you have with your health care provider.  

## 2014-03-29 NOTE — Progress Notes (Signed)
   Subjective:    Patient ID: Steve RockersWilliam Byrd, male    DOB: 1955-06-26, 58 y.o.   MRN: [id number]  HPI  TRIM MY NAILS.  Review of Systems no new findings or systemic changes noted     Objective:   Physical Exam Neurovascular status unchanged pedal pulses palpable DP +2 over 4 and PT +2 over 4 bilateral Refill time 3 seconds all digits epicritic sensations intact and symmetric there is normal plantar response DTRs no elicited dermatologic his skin color pigment normal mild varicosities noted the nails are black due to socket went in picked up by the Penlac on his nails nails thick brittle [place] friable dystrophic tender both on palpation and with enclosed shoe wear. No open wounds no ulcers no secondary infection is noted distal clavus second left following debridement       Assessment & Plan:  Assessment this time is diabetes with history peripheral neuropathy painful mycotic brittle crumbly nails 1 through 5 bilateral debrided at this time no other new changes or medications noted may continue with topical Penlac daily as instructed should have adequate refills from previous visit at this time recheck in 3 months for follow-up continued palliative care in the future as needed  Alvan Dameichard Meric Joye DPM

## 2014-04-14 ENCOUNTER — Ambulatory Visit (HOSPITAL_BASED_OUTPATIENT_CLINIC_OR_DEPARTMENT_OTHER): Payer: Medicare Other | Attending: Pulmonary Disease | Admitting: Radiology

## 2014-04-14 DIAGNOSIS — G4733 Obstructive sleep apnea (adult) (pediatric): Secondary | ICD-10-CM

## 2014-04-14 DIAGNOSIS — Z9989 Dependence on other enabling machines and devices: Principal | ICD-10-CM

## 2014-04-28 ENCOUNTER — Telehealth: Payer: Self-pay | Admitting: Pulmonary Disease

## 2014-04-28 DIAGNOSIS — G4733 Obstructive sleep apnea (adult) (pediatric): Secondary | ICD-10-CM

## 2014-04-28 NOTE — Telephone Encounter (Signed)
mindy please advise. Thanks

## 2014-04-28 NOTE — Telephone Encounter (Signed)
Per 03/16/14 OV w/ [facility]: Will arrange for a mask fitting session at the sleep center.  Once this is done, will get a 2 week download to see if your leak has resolved and if your sleep apnea is better controlled.  If not, will need to make adjustments to your pressure. Keep up with mask cushion changes and supplies Please call us once you get your new mask if you do not hear from your home care company about doing another download in 2 weeks.  ---  lmomtcb x1 for spouse. Was download done? I have not received anything

## 2014-04-29 NOTE — Telephone Encounter (Signed)
Download for Michele RockersWilliam Skufca [facility] Shelle Ironlance. Rhonda J Cobb

## 2014-04-30 NOTE — Telephone Encounter (Signed)
Download received and placed in [facility] green folder. Please advise thanks 

## 2014-05-03 NOTE — Telephone Encounter (Signed)
lmomtcb x1 

## 2014-05-03 NOTE — Telephone Encounter (Signed)
Let pt know that he has significant ongoing mask leak, and is causing him to continue having sleep apnea even on cpap.  Would like for them to go back to sleep center for RE-FITTING.  Please put in order for this ([id number]) if they are ok with this.

## 2014-05-03 NOTE — Telephone Encounter (Signed)
Pat called back. Aware of recs. Order placed

## 2014-05-18 ENCOUNTER — Other Ambulatory Visit (HOSPITAL_BASED_OUTPATIENT_CLINIC_OR_DEPARTMENT_OTHER): Payer: Medicare Other

## 2014-05-24 ENCOUNTER — Other Ambulatory Visit (HOSPITAL_BASED_OUTPATIENT_CLINIC_OR_DEPARTMENT_OTHER): Payer: Medicare Other

## 2014-05-25 ENCOUNTER — Ambulatory Visit (HOSPITAL_BASED_OUTPATIENT_CLINIC_OR_DEPARTMENT_OTHER): Payer: Medicare Other | Attending: Pulmonary Disease | Admitting: Radiology

## 2014-05-25 DIAGNOSIS — Z9989 Dependence on other enabling machines and devices: Principal | ICD-10-CM

## 2014-05-25 DIAGNOSIS — G4733 Obstructive sleep apnea (adult) (pediatric): Secondary | ICD-10-CM

## 2014-06-28 ENCOUNTER — Ambulatory Visit (INDEPENDENT_AMBULATORY_CARE_PROVIDER_SITE_OTHER): Payer: Medicare Other

## 2014-06-28 VITALS — BP 163/139 | HR 56 | Resp 18

## 2014-06-28 DIAGNOSIS — M2010 Hallux valgus (acquired), unspecified foot: Secondary | ICD-10-CM

## 2014-06-28 DIAGNOSIS — E1140 Type 2 diabetes mellitus with diabetic neuropathy, unspecified: Secondary | ICD-10-CM | POA: Diagnosis not present

## 2014-06-28 DIAGNOSIS — L603 Nail dystrophy: Secondary | ICD-10-CM

## 2014-06-28 DIAGNOSIS — M2040 Other hammer toe(s) (acquired), unspecified foot: Secondary | ICD-10-CM

## 2014-06-28 DIAGNOSIS — R0989 Other specified symptoms and signs involving the circulatory and respiratory systems: Secondary | ICD-10-CM

## 2014-06-28 DIAGNOSIS — I999 Unspecified disorder of circulatory system: Secondary | ICD-10-CM

## 2014-06-28 NOTE — Progress Notes (Signed)
   Subjective:    Patient ID: Steve RockersWilliam Puchalski, male    DOB: 1956/01/03, 59 y.o.   MRN: [id number]  HPI I AM HERE TO GET MY TOENAILS TRIMMED UP    Review of Systems no new findings or systemic changes     Objective:   Physical Exam Neurovascular status is unchanged pedal pulses palpable DP and PT +2 over 4 bilateral Refill time 3 seconds all digits DTRs not elicited there is normal plantar response patient does have thick brittle crumbly dystrophic nails been applying the Penlac as instructed in the nails are debrided 1 through 5 bilateral. No open wounds no ulcers no secondary infections nails painful tender on palpation and debridement.       Assessment & Plan:  Assessment this time is onychomycosis with painful mycotic nails and presence of diabetes and complication with mild peripheral neuropathy noted painful mycotic nails are debrided 1 through 5 bilateral continue with Penlac daily as instructed reappointed 3 months for follow-up and continued palliative care in the future as needed  Alvan Dameichard Luisfelipe Engelstad DPM

## 2014-09-22 ENCOUNTER — Other Ambulatory Visit: Payer: Self-pay

## 2014-09-27 ENCOUNTER — Ambulatory Visit: Payer: Medicare Other

## 2014-10-11 ENCOUNTER — Ambulatory Visit (INDEPENDENT_AMBULATORY_CARE_PROVIDER_SITE_OTHER): Payer: Medicare Other | Admitting: Podiatry

## 2014-10-11 ENCOUNTER — Encounter: Payer: Self-pay | Admitting: Podiatry

## 2014-10-11 VITALS — BP 124/72 | HR 62 | Resp 18

## 2014-10-11 DIAGNOSIS — M79675 Pain in left toe(s): Secondary | ICD-10-CM

## 2014-10-11 DIAGNOSIS — M79674 Pain in right toe(s): Secondary | ICD-10-CM

## 2014-10-11 DIAGNOSIS — M79676 Pain in unspecified toe(s): Secondary | ICD-10-CM

## 2014-10-11 DIAGNOSIS — E1151 Type 2 diabetes mellitus with diabetic peripheral angiopathy without gangrene: Secondary | ICD-10-CM

## 2014-10-11 DIAGNOSIS — B351 Tinea unguium: Secondary | ICD-10-CM

## 2014-10-11 NOTE — Progress Notes (Signed)
Patient ID: Steve RockersWilliam Vanderberg, male   DOB: 17-Jan-1956, 59 y.o.   MRN: [id number] Complaint:  Visit Type: Patient returns to my office for continued preventative foot care services. Complaint: Patient states" my nails have grown long and thick and become painful to walk and wear shoes" Patient has been diagnosed with DM with no complications. He presents for preventative foot care services. No changes to ROS  Podiatric Exam: Vascular: dorsalis pedis and posterior tibial pulses are palpable bilateral. Capillary return is immediate. Temperature gradient is WNL. Skin turgor WNL  Sensorium: Normal Semmes Weinstein monofilament test. Normal tactile sensation bilaterally. Nail Exam: Pt has thick disfigured discolored nails with subungual debris noted bilateral entire nail hallux through fifth toenails Ulcer Exam: There is no evidence of ulcer or pre-ulcerative changes or infection. Orthopedic Exam: Muscle tone and strength are WNL. No limitations in general ROM. No crepitus or effusions noted. Foot type and digits show no abnormalities. Bony prominences are unremarkable. Skin: No Porokeratosis. No infection or ulcers  Diagnosis:  Tinea unguium, Pain in right toe, pain in left toes  Treatment & Plan Procedures and Treatment: Consent by patient was obtained for treatment procedures. The patient understood the discussion of treatment and procedures well. All questions were answered thoroughly reviewed. Debridement of mycotic and hypertrophic toenails, 1 through 5 bilateral and clearing of subungual debris. No ulceration, no infection noted.  Return Visit-Office Procedure: Patient instructed to return to the office for a follow up visit 3 months for continued evaluation and treatment. Continue using penlac as directed

## 2014-12-13 ENCOUNTER — Telehealth: Payer: Self-pay | Admitting: *Deleted

## 2014-12-13 MED ORDER — CICLOPIROX 8 % EX SOLN: Freq: Every day | CUTANEOUS | Status: DC

## 2014-12-13 NOTE — Telephone Encounter (Signed)
Pharmacist states they can not refill pt's medication under the current prescribing doctor.  Dr. Ralene Cork had previously refilled pt's Penlac.  Dr. Stacie Acres refilled Penlac as previously with +11 refills.

## 2014-12-29 ENCOUNTER — Telehealth: Payer: Self-pay | Admitting: *Deleted

## 2014-12-29 NOTE — Telephone Encounter (Signed)
Pt's caregiver called of Penlac refills.  Pt was given refills on 12/13/2014 by Dr. Stacie Acres.

## 2015-01-07 NOTE — Telephone Encounter (Signed)
Entered in error

## 2015-01-10 ENCOUNTER — Ambulatory Visit (INDEPENDENT_AMBULATORY_CARE_PROVIDER_SITE_OTHER): Payer: Medicare Other | Admitting: Podiatry

## 2015-01-10 ENCOUNTER — Encounter: Payer: Self-pay | Admitting: Podiatry

## 2015-01-10 DIAGNOSIS — M79675 Pain in left toe(s): Secondary | ICD-10-CM | POA: Diagnosis not present

## 2015-01-10 DIAGNOSIS — M79674 Pain in right toe(s): Secondary | ICD-10-CM | POA: Diagnosis not present

## 2015-01-10 DIAGNOSIS — B351 Tinea unguium: Secondary | ICD-10-CM

## 2015-01-10 DIAGNOSIS — E1140 Type 2 diabetes mellitus with diabetic neuropathy, unspecified: Secondary | ICD-10-CM

## 2015-01-10 DIAGNOSIS — M79676 Pain in unspecified toe(s): Secondary | ICD-10-CM | POA: Diagnosis not present

## 2015-01-10 MED ORDER — CICLOPIROX 8 % EX SOLN: Freq: Every day | CUTANEOUS | Status: AC

## 2015-01-10 NOTE — Progress Notes (Signed)
Patient ID: Steve Byrd, male   DOB: 07/15/1955, 59 y.o.   MRN: [id number] Complaint:  Visit Type: Patient returns to my office for continued preventative foot care services. Complaint: Patient states" my nails have grown long and thick and become painful to walk and wear shoes" Patient has been diagnosed with DM with no complications. He presents for preventative foot care services. No changes to ROS  Podiatric Exam: Vascular: dorsalis pedis and posterior tibial pulses are palpable bilateral. Capillary return is immediate. Temperature gradient is WNL. Skin turgor WNL  Sensorium: Normal Semmes Weinstein monofilament test. Normal tactile sensation bilaterally. Nail Exam: Pt has thick disfigured discolored nails with subungual debris noted bilateral entire nail hallux through fifth toenails Ulcer Exam: There is no evidence of ulcer or pre-ulcerative changes or infection. Orthopedic Exam: Muscle tone and strength are WNL. No limitations in general ROM. No crepitus or effusions noted. Foot type and digits show no abnormalities. Bony prominences are unremarkable. Skin: No Porokeratosis. No infection or ulcers  Diagnosis:  Tinea unguium, Pain in right toe, pain in left toes  Treatment & Plan Procedures and Treatment: Consent by patient was obtained for treatment procedures. The patient understood the discussion of treatment and procedures well. All questions were answered thoroughly reviewed. Debridement of mycotic and hypertrophic toenails, 1 through 5 bilateral and clearing of subungual debris. No ulceration, no infection noted.  Return Visit-Office Procedure: Patient instructed to return to the office for a follow up visit 3 months for continued evaluation and treatment. Prescribed penlac for use.

## 2015-02-01 DEATH — deceased

## 2015-04-18 ENCOUNTER — Ambulatory Visit: Payer: Medicare Other | Admitting: Podiatry
